# Patient Record
Sex: Male | Born: 1965 | Race: White | Hispanic: No | Marital: Married | Smoking: Former smoker
Health system: Southern US, Community
[De-identification: ages and names within clinical notes are randomized; demographics above are authoritative.]

---

## 2004-09-23 ENCOUNTER — Ambulatory Visit (HOSPITAL_COMMUNITY): Admission: RE | Admit: 2004-09-23 | Discharge: 2004-09-23 | Payer: Self-pay | Admitting: General Surgery

## 2004-11-23 ENCOUNTER — Ambulatory Visit (HOSPITAL_COMMUNITY): Admission: RE | Admit: 2004-11-23 | Discharge: 2004-11-23 | Payer: Self-pay | Admitting: Internal Medicine

## 2006-04-04 IMAGING — CT CT ABDOMEN W/ CM
1 of 4 series · 14 of 32 positions shown, 19 images · non-contrast
Comparison: none

CLINICAL DATA: Enlarged mesenteric lymph nodes.

[Series 2: abd_pel 5.0 b40f st · axial · 0.71mm/px · z∈[-508,-118]mm · 14 of 90 slices shown, 19 images]
[im 6/90  soft-tissue]
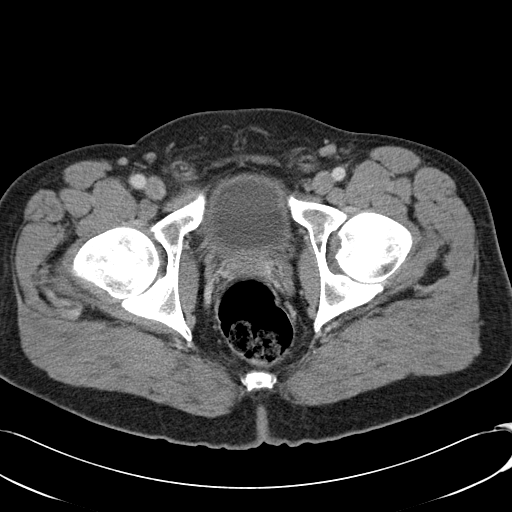
[im 6/90  bone]
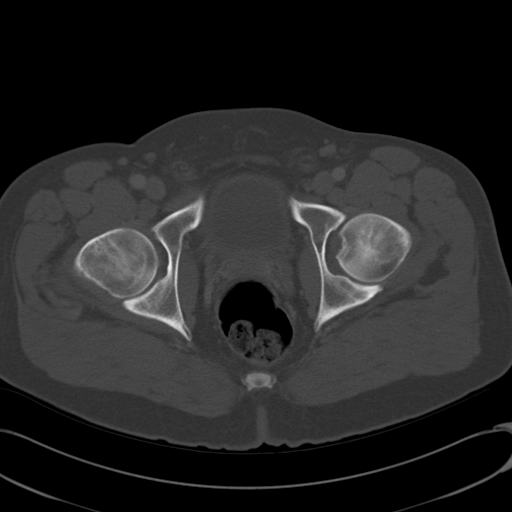
[im 11/90  soft-tissue]
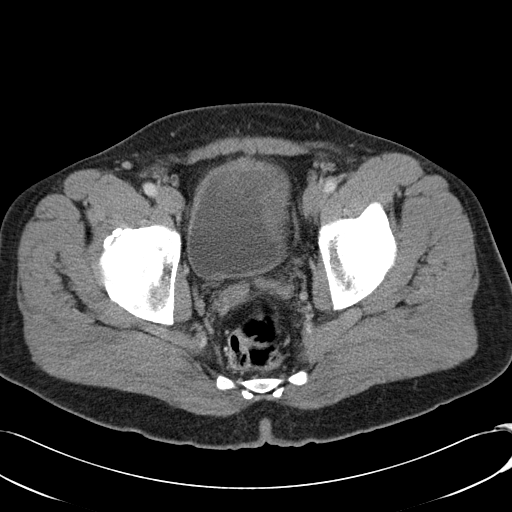
[im 21/90  soft-tissue]
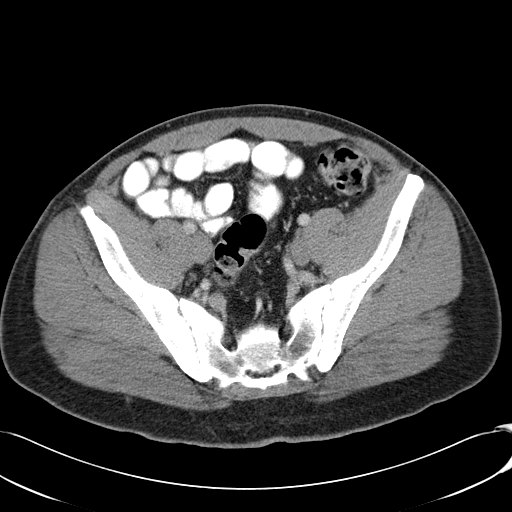
[im 27/90  soft-tissue]
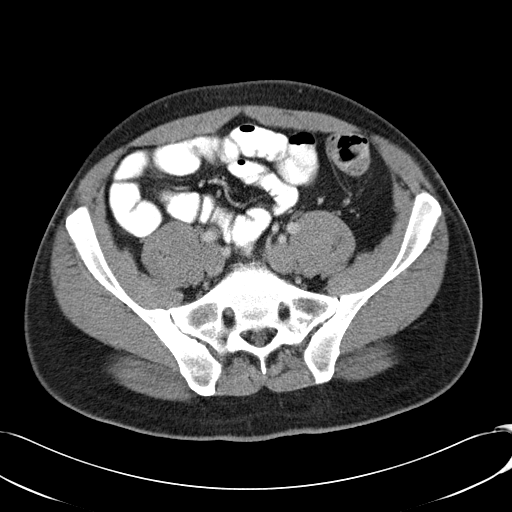
[im 32/90  soft-tissue]
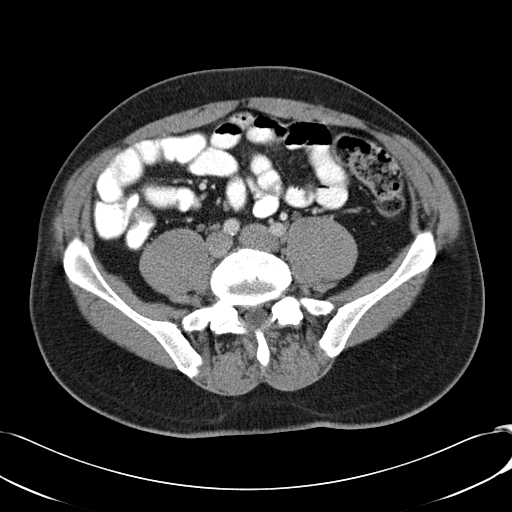
[im 37/90  soft-tissue]
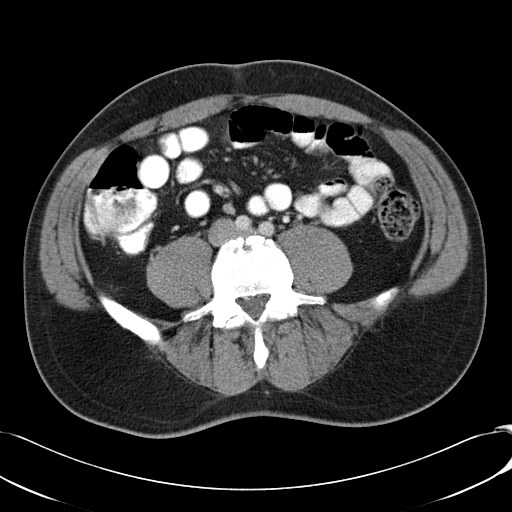
[im 48/90  soft-tissue]
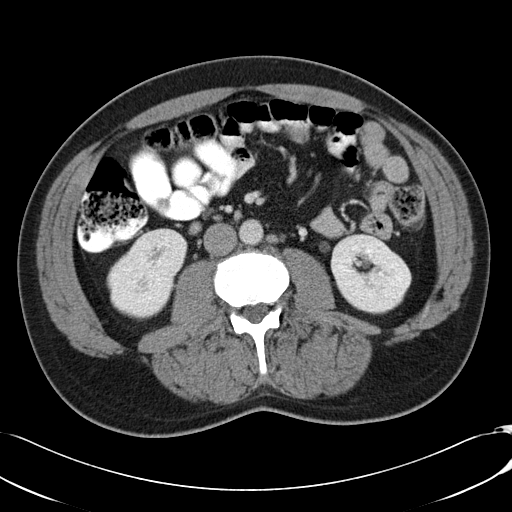
[im 53/90  soft-tissue]
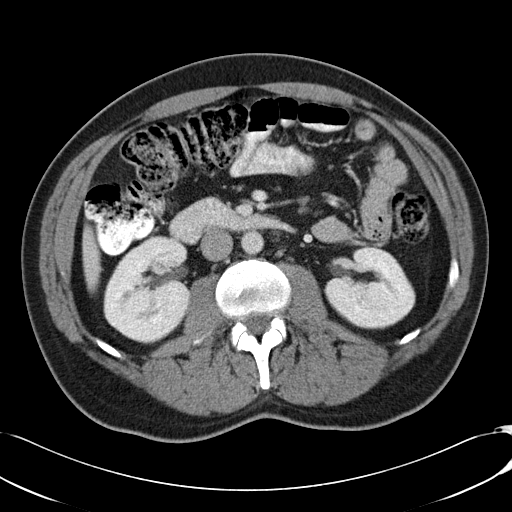
[im 58/90  soft-tissue]
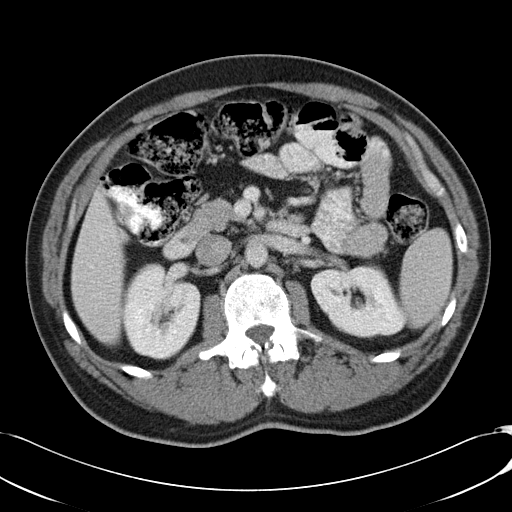
[im 58/90  bone]
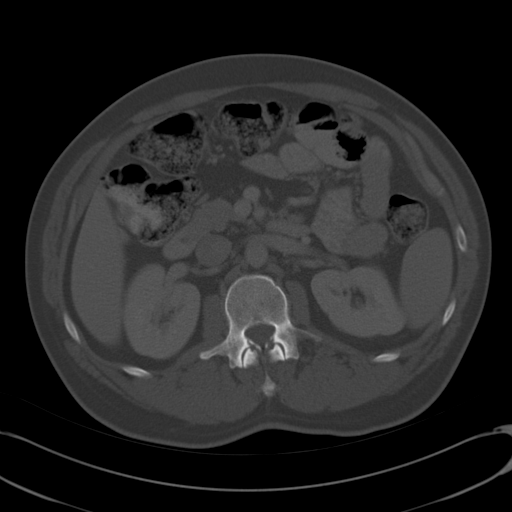
[im 63/90  soft-tissue]
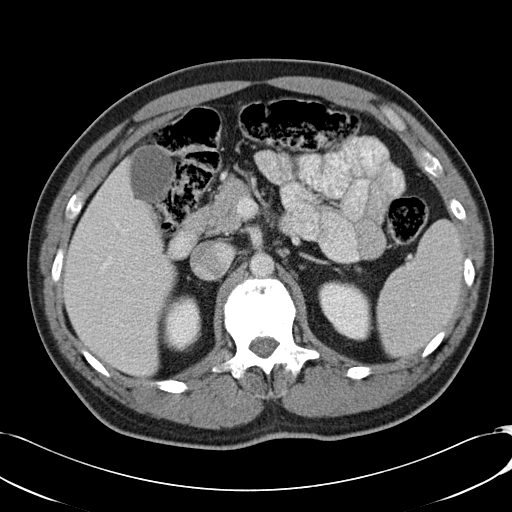
[im 69/90  soft-tissue]
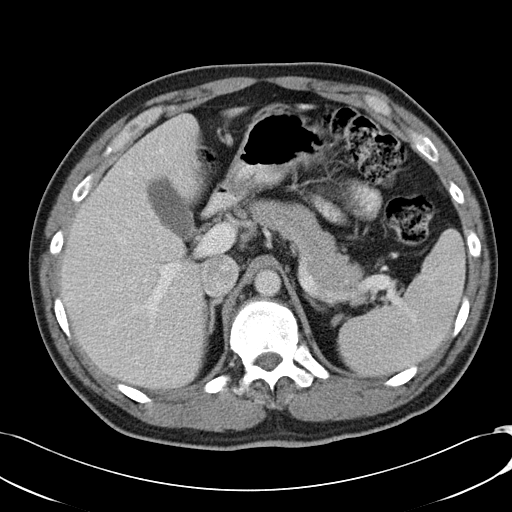
[im 69/90  lung]
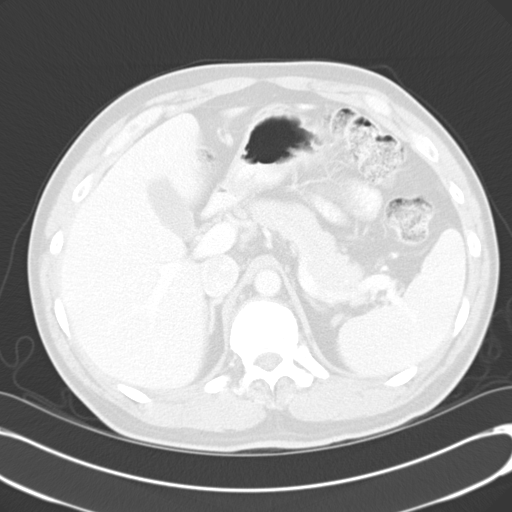
[im 74/90  lung]
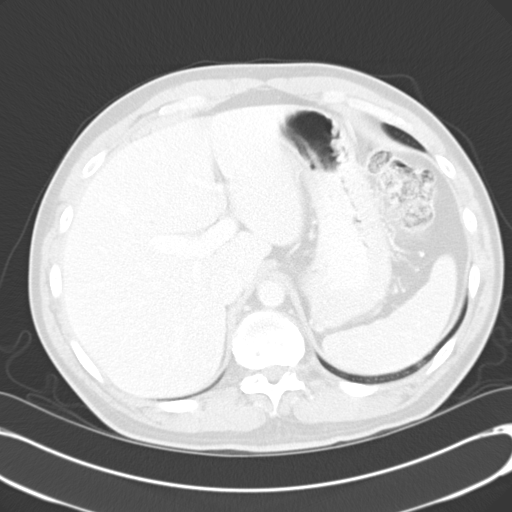
[im 79/90  soft-tissue]
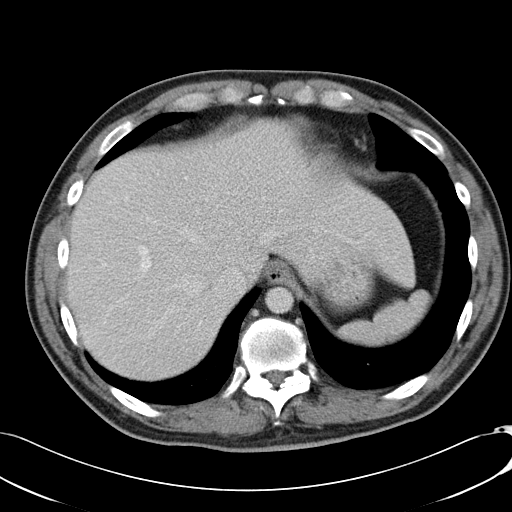
[im 79/90  lung]
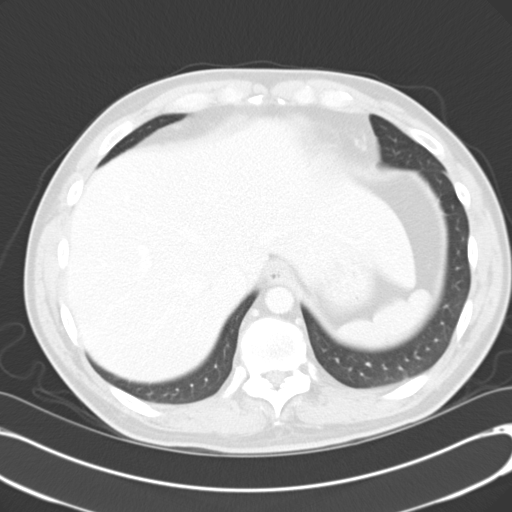
[im 84/90  soft-tissue]
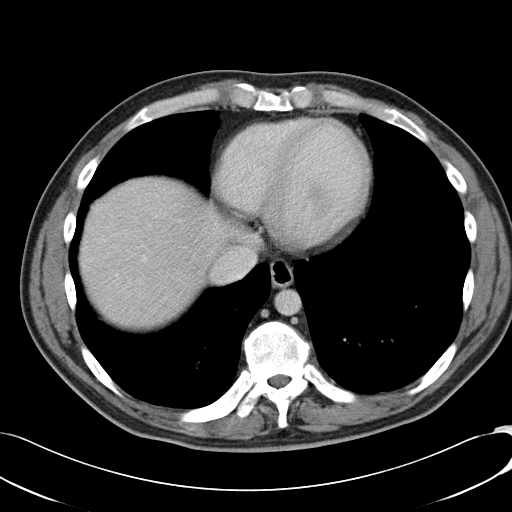
[im 84/90  lung]
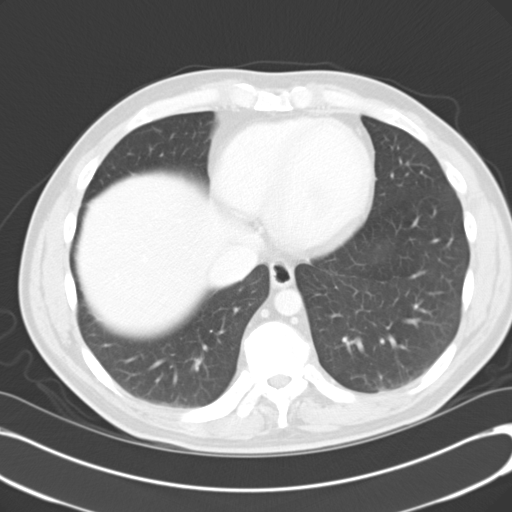

[14 of 32 positions shown; findings below may reference images not displayed]

CT abdomen with contrast:

Multidetector helical CT after 125 ml Qmnipaque-QEE IV.
Comparison 09/23/2004. Visualized portions of lung bases clear. Unremarkable
liver, gallbladder, spleen, pancreas, kidneys, adrenal glands, abdominal aorta.
Prominent lymph nodes at the root of the mesentery, measuring up to 11 mm in
short axis diameter, overall stable in size and number since the previous scan.
Gastrohepatic lymph node has slightly decreased in size, 9 mm in short axis
diameter (image #19). Prominent subcentimeter left paraaortic and aortocaval
lymph nodes are also noted. Small bowel remains decompressed, unremarkable. No
ascites. No free air.
IMPRESSION: 1. Stable appearance of retroperitoneal and central mesenteric lymph nodes

CT pelvis with contrast:

Normal appendix. Colon nondistended, unremarkable. No free fluid. Urinary
bladder incompletely distended. No adenopathy localized. Bilateral degenerative
changes in sacroiliac joints.
IMPRESSION: 1. Normal appendix. Unremarkable CT pelvis.

## 2007-06-26 ENCOUNTER — Ambulatory Visit (HOSPITAL_COMMUNITY): Admission: RE | Admit: 2007-06-26 | Discharge: 2007-06-26 | Payer: Self-pay | Admitting: Internal Medicine

## 2010-10-30 NOTE — Consult Note (Signed)
NAMEJAYMIAN, BOGART NO.:  [id number]   MEDICAL RECORD NO.:  [id number]          PATIENT TYPE:  OUT   LOCATION:  XRAY                         FACILITY:  [facility]   PHYSICIAN:  Ollen Gross. Vernell Morgans, M.D. DATE OF BIRTH:  09-Oct-1965   DATE OF CONSULTATION:  09/23/2004  DATE OF DISCHARGE:                                   CONSULTATION   Caleb Price is a 45 year old white male who presented to Urgent Care earlier  today with burning sensation in the mid abdomen.  He had a little bit of  nausea but no vomiting.  He has had some problems with constipation in the  recent past. He was sent to [facility] for CT scan and is being  seen in the lobby of radiology.  He states that his pain is now resolved and  he is not having any fevers.   PAST MEDICAL HISTORY:  1.  Ankylosing spondylitis.  2.  Iritis.   PAST SURGICAL HISTORY:  None.   MEDICATIONS:  Celebrex and Prilosec.   ALLERGIES:  No known drug allergies.   SOCIAL HISTORY:  He denies the use of alcohol or tobacco products.   FAMILY HISTORY:  Noncontributory.   PHYSICAL EXAMINATION:  VITAL SIGNS:  He is afebrile with stable vital signs.  GENERAL:  He is a well-developed, well-nourished black male in no acute  distress.  SKIN: Warm and dry.  No jaundice.  HEENT:  Extraocular muscles were intact.  Pupils are equal, round and  reactive to light.  Sclerae nonicteric.  LUNGS:  Clear bilaterally with no use of accessory respiratory muscles.  HEART:  Regular rate and rhythm with an impulse in the left chest.  ABDOMEN:  Soft, nontender.  No guarding or peritoneal signs.  No palpable  mass or hepatosplenomegaly.  EXTREMITIES:  No clubbing, cyanosis or edema. Good strength in his arms and  legs.  PSYCHIATRIC:  Alert and oriented x3 with no evidence of anxiety or  depression.   LABORATORY DATA:  On reviewing the CT scan with the radiologist, he had no  evidence of obstruction, no evidence of colitis, no evidence  of  appendicitis.  He did have some enlarged lymph nodes in his mesentery but  nothing else acute-appearing.   ASSESSMENT/PLAN:  This is a 45 year old white male with some abdominal pain  earlier today that is now resolved.  He looks well and has no acute findings  on his physical examination or on his CT scan.  I have recommended that he  follow up with his medical doctor to further work up these enlarged lymph  nodes and  intermittent abdominal burning.  He feels that he may need a referral to a  gastroenterologist to help figure this out.  He has our office number and  agrees to call us immediately if his pain comes back or if he feels worse in  any way.  Otherwise we will plan to see him back on a p.r.n. basis.      PST/MEDQ  D:  09/23/2004  T:  09/24/2004  Job:  [id number]

## 2016-11-25 DIAGNOSIS — M533 Sacrococcygeal disorders, not elsewhere classified: Secondary | ICD-10-CM | POA: Insufficient documentation

## 2016-11-25 DIAGNOSIS — G8929 Other chronic pain: Secondary | ICD-10-CM | POA: Insufficient documentation

## 2016-11-25 DIAGNOSIS — M545 Low back pain: Secondary | ICD-10-CM

## 2016-11-25 NOTE — Progress Notes (Signed)
Office Visit Note  Patient: Caleb Price             Date of Birth: [birth year]           MRN: [id number]             PCP: Katherina Mires, MD Referring: No ref. provider found Visit Date: 11/29/2016 Occupation: operator (pumps concrete)    Subjective:  Neck and lower back pain.   History of Present Illness: Caleb Price is a 51 y.o. male seen in consultation per request of Dr. Suzanna Obey. According to patient his symptoms started at age 39 with hip pain. He states he learned to live with the pain. He was seeing a chiropractor off and on. At age 70 he developed iritis and was seen by an ophthalmologist which was treated with topical eyedrops. He had recurrence of symptoms 30 days later at the time he was seen by his PCP and had labs and x-rays. 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 Abner Greenspan is seen Dr. Abner Greenspan for multiple years. He was initially treated with Celebrex then different anti-inflammatories and finally had been on Mobic. He's been having arm discomfort in his neck and his bilateral feet. His been experiencing numbness in his bilateral hands. He was recently seen by Dr. Amil Amen who did some lab work and advised Humira. Patient was not satisfied with this visit.Marland Kitchen He describes pain in the bottom of his bilateral feet. He has history of right foot Morton's neuroma which was treated with cortisone injection. He denies any discomfort in the thoracic and lumbar region he is not having any SI joint pain currently. Although he complains of some SI joint pain off and on. He states his hip joint discomfort flares off and on which is not causing any discomfort currently. None of the other joints are painful or swollen. He had no recurrence of iritis since the initial episode.  Activities of Daily Living:  Patient reports morning stiffness for 1 hour.   Patient Denies nocturnal pain.  Difficulty dressing/grooming: Denies Difficulty climbing stairs:  Denies Difficulty getting out of chair: Denies Difficulty using hands for taps, buttons, cutlery, and/or writing: Denies   Review of Systems  Constitutional: Positive for fatigue. Negative for night sweats and weakness ( ).  HENT: Negative for mouth sores, mouth dryness and nose dryness.   Eyes: Negative for redness and dryness.  Respiratory: Negative for shortness of breath and difficulty breathing.   Cardiovascular: Negative for chest pain, palpitations, hypertension, irregular heartbeat and swelling in legs/feet.  Gastrointestinal: Positive for constipation. Negative for diarrhea.  Endocrine: Negative for increased urination.  Musculoskeletal: Positive for arthralgias, joint pain and morning stiffness. Negative for joint swelling, myalgias, muscle weakness, muscle tenderness and myalgias.  Skin: Negative for color change, rash, hair loss, nodules/bumps, skin tightness, ulcers and sensitivity to sunlight.  Allergic/Immunologic: Negative for susceptible to infections.  Neurological: Negative for dizziness, fainting, memory loss and night sweats.  Hematological: Negative for swollen glands.  Psychiatric/Behavioral: Negative for depressed mood and sleep disturbance. The patient is not nervous/anxious.     PMFS History:  Patient Active Problem List   Diagnosis Date Noted  . Primary osteoarthritis of both feet 11/29/2016  . DJD (degenerative joint disease), cervical 11/29/2016  . Osteoarthritis of lumbar spine 11/29/2016  . Sacral pain 11/25/2016  . Chronic midline low back pain without sciatica 11/25/2016    History reviewed. No pertinent past medical history.  No family history on file.  History reviewed. No pertinent surgical history. Social History   Social History Narrative  . No narrative on file     Objective: Vital Signs: BP 138/74 (BP Location: Right Arm)   Pulse 88   Resp 16   Ht '6\' 1"'  (1.854 m)   Wt 235 lb (106.6 kg)   BMI 31.00 kg/m    Physical Exam   Constitutional: He is oriented to person, place, and time. He appears well-developed and well-nourished.  HENT:  Head: Normocephalic and atraumatic.  Eyes: Conjunctivae and EOM are normal. Pupils are equal, round, and reactive to light.  Neck: Normal range of motion. Neck supple.  Cardiovascular: Normal rate, regular rhythm and normal heart sounds.   Pulmonary/Chest: Effort normal and breath sounds normal.  Abdominal: Soft. Bowel sounds are normal.  Neurological: He is alert and oriented to person, place, and time.  Skin: Skin is warm and dry. Capillary refill takes less than 2 seconds.  Psychiatric: He has a normal mood and affect. His behavior is normal.  Nursing note and vitals reviewed.    Musculoskeletal Exam: C-spine some discomfort with lateral rotation. Thoracic spine he has mild kyphosis and some limitation. Lumbar spine good range of motion he was able to reach his toes. Shoulder joints elbow joints wrist joint MCPs PIPs DIPs with good range of motion. He has some thickening of PIP/DIP in his hands and feet consistent with osteoarthritis. Hip joints are good range of motion. He had no tenderness on palpation over SI joints.  CDAI Exam: No CDAI exam completed.    Investigation: Findings:  06/18/16 CMP Normal, CBC Normal, UA normal  06/08/2016 xray report show no acute lumbar spine findings. Mild DDDD at L5/S1 and L3/4. Small to moderated osteophyte formation anteriorly throughout the remainder of the lumbar spine without loss of disc space height. Possible etiology is DISH.  06/18/16 CBC CMP ESR Normal, CRP 5.9  08/13/16 negative TB gold and Hepatitis Panel     Imaging: Xr Hip Unilat W Or W/o Pelvis 2-3 Views Right  Result Date: 11/29/2016 No significant hip joint narrowing was noted. Mild SI joint sclerosis was noted. Which could be due to underlying osteoarthritis. No SI joint narrowing was noted. These findings could be consistent with osteoarthritis or chronic inflammatory  arthritis.  Xr Thoracic Spine 2 View  Result Date: 11/29/2016 Mild is spondylosis with small lateral osteophytes noted at multiple levels. Impression: Mild is spondylosis of thoracic spine  Xr Cervical Spine 2 Or 3 Views  Result Date: 11/29/2016 Multilevel spondylosis. Anterior spurring. Most prominent narrowing between C5-C6 and C6-7 Impression: Multilevel spondylosis with most prominent narrowing between C5 and C6  Xr Foot 2 Views Left  Result Date: 11/29/2016 Left first MTP all PIP/DIP narrowing was noted. No erosive changes were noted. No intertarsal joint space narrowing noted. Small calcaneal spur was noted. Impression: These findings are consistent with osteoarthritis of the left foot  Xr Foot 2 Views Right  Result Date: 11/29/2016 First MTP all PIP/DIP narrowing was noted. No other MTP narrowing or erosive changes were noted. No intertarsal joint space narrowing was noted. A small calcaneal spur was noted. These findings were consistent with osteoarthritis of the foot.  Xr Lumbar Spine 2-3 Views  Result Date: 11/29/2016 Dextroscoliosis of lumbar spine was noted with lateral osteophytes no syndesmophytes were noted.. Facet joint arthropathy was noted. Impression: Multilevel spondylosis with osteophytes   Speciality Comments: No specialty comments available.    Procedures:  No procedures performed Allergies: Patient has no known allergies.  Assessment / Plan:     Visit Diagnoses: Sacral pain - HLA B27+ , he has had history of sacral pain which has not been an issue recently. His x-rays revealed mild sclerosis in the SI joint. I will schedule MRI of the SI joint 2 rule out sacroiliitis.- Plan: XR HIP UNILAT W OR W/O PELVIS 2-3 VIEWS RIGHT  Pain, neck - Plan: XR Cervical Spine 2 or 3 views. X-rays were consistent with multilevel spondylosis with significant narrowing between C6-7. He's been having radiculopathy to bilateral upper extremities. I'll schedule MRI of his C-spine to  evaluate this further.  Pain in thoracic spine - Plan: XR Thoracic Spine 2 View: X-ray showed mild spondylosis no syndesmophytes were noted.  Chronic midline low back pain without sciatica - Plan: XR Lumbar Spine 2-3 Views. X-rays showed multilevel spondylosis and facet joint arthropathy. No syndesmophytes were noted.  Iritis/ one episode 2001 with no recurrence   Pain in both feet - Plan: XR Foot 2 Views Right, XR Foot 2 Views Left: X-rays today were consistent with-Primary osteoarthritis of both feet  Long-term NSAID use. Side effects of long-term use of NSAIDs was discussed. I'll obtain following labs today. Need for regular walks exercise was discussed. And natural aunt inflammatory were discussed.  Other fatigue - Plan: CBC with Differential/Platelet, COMPLETE METABOLIC PANEL WITH GFR, Sedimentation rate    Orders: Orders Placed This Encounter  Procedures  . XR Cervical Spine 2 or 3 views  . XR Thoracic Spine 2 View  . XR Lumbar Spine 2-3 Views  . XR HIP UNILAT W OR W/O PELVIS 2-3 VIEWS RIGHT  . XR Foot 2 Views Right  . XR Foot 2 Views Left  . MR CERVICAL SPINE WO CONTRAST  . MR SACRUM SI JOINTS WO CONTRAST  . CBC with Differential/Platelet  . COMPLETE METABOLIC PANEL WITH GFR  . Sedimentation rate   No orders of the defined types were placed in this encounter.   Face-to-face time spent with patient was 60 minutes. 50% of time was spent in counseling and coordination of care.  Follow-Up Instructions: Return for DDD.   Bo Merino, MD  Note - This record has been created using Editor, commissioning.  Chart creation errors have been sought, but may not always  have been located. Such creation errors do not reflect on  the standard of medical care.

## 2016-11-29 ENCOUNTER — Ambulatory Visit (INDEPENDENT_AMBULATORY_CARE_PROVIDER_SITE_OTHER): Payer: 59

## 2016-11-29 ENCOUNTER — Encounter: Payer: Self-pay | Admitting: Rheumatology

## 2016-11-29 ENCOUNTER — Ambulatory Visit (INDEPENDENT_AMBULATORY_CARE_PROVIDER_SITE_OTHER): Payer: 59 | Admitting: Rheumatology

## 2016-11-29 ENCOUNTER — Encounter (INDEPENDENT_AMBULATORY_CARE_PROVIDER_SITE_OTHER): Payer: Self-pay

## 2016-11-29 VITALS — BP 138/74 | HR 88 | Resp 16 | Ht 73.0 in | Wt 235.0 lb

## 2016-11-29 DIAGNOSIS — M545 Low back pain, unspecified: Secondary | ICD-10-CM

## 2016-11-29 DIAGNOSIS — M79671 Pain in right foot: Secondary | ICD-10-CM

## 2016-11-29 DIAGNOSIS — M542 Cervicalgia: Secondary | ICD-10-CM

## 2016-11-29 DIAGNOSIS — R5383 Other fatigue: Secondary | ICD-10-CM

## 2016-11-29 DIAGNOSIS — M546 Pain in thoracic spine: Secondary | ICD-10-CM

## 2016-11-29 DIAGNOSIS — M47816 Spondylosis without myelopathy or radiculopathy, lumbar region: Secondary | ICD-10-CM | POA: Diagnosis not present

## 2016-11-29 DIAGNOSIS — H209 Unspecified iridocyclitis: Secondary | ICD-10-CM | POA: Diagnosis not present

## 2016-11-29 DIAGNOSIS — M5030 Other cervical disc degeneration, unspecified cervical region: Secondary | ICD-10-CM | POA: Diagnosis not present

## 2016-11-29 DIAGNOSIS — M533 Sacrococcygeal disorders, not elsewhere classified: Secondary | ICD-10-CM

## 2016-11-29 DIAGNOSIS — M79672 Pain in left foot: Secondary | ICD-10-CM

## 2016-11-29 DIAGNOSIS — M47812 Spondylosis without myelopathy or radiculopathy, cervical region: Secondary | ICD-10-CM | POA: Insufficient documentation

## 2016-11-29 DIAGNOSIS — M19071 Primary osteoarthritis, right ankle and foot: Secondary | ICD-10-CM | POA: Diagnosis not present

## 2016-11-29 DIAGNOSIS — G8929 Other chronic pain: Secondary | ICD-10-CM | POA: Diagnosis not present

## 2016-11-29 DIAGNOSIS — M19072 Primary osteoarthritis, left ankle and foot: Secondary | ICD-10-CM

## 2016-11-29 LAB — CBC WITH DIFFERENTIAL/PLATELET
BASOS PCT: 0 %
Basophils Absolute: 0 cells/uL (ref 0–200)
EOS PCT: 3 %
Eosinophils Absolute: 291 cells/uL (ref 15–500)
HCT: 45.3 % (ref 38.5–50.0)
HEMOGLOBIN: 15.1 g/dL (ref 13.2–17.1)
LYMPHS ABS: 2134 {cells}/uL (ref 850–3900)
Lymphocytes Relative: 22 %
MCH: 28.6 pg (ref 27.0–33.0)
MCHC: 33.3 g/dL (ref 32.0–36.0)
MCV: 85.8 fL (ref 80.0–100.0)
MPV: 9.8 fL (ref 7.5–12.5)
Monocytes Absolute: 679 cells/uL (ref 200–950)
Monocytes Relative: 7 %
NEUTROS PCT: 68 %
Neutro Abs: 6596 cells/uL (ref 1500–7800)
Platelets: 250 10*3/uL (ref 140–400)
RBC: 5.28 MIL/uL (ref 4.20–5.80)
RDW: 13.6 % (ref 11.0–15.0)
WBC: 9.7 10*3/uL (ref 3.8–10.8)

## 2016-11-29 NOTE — Patient Instructions (Signed)
Natural anti-inflammatories  You can purchase these at [employer], [employer] or online.  . Turmeric (capsules)  . Ginger (ginger root or capsules)  . Omega 3 (Fish, flax seeds, chia seeds, walnuts, almonds)  . Tart cherry (dried or extract)   Patient should be under the care of a physician while taking these supplements. This may not be reproduced without the permission of Dr. Eldar Robitaille.  

## 2016-11-30 LAB — COMPLETE METABOLIC PANEL WITH GFR
ALT: 30 U/L (ref 9–46)
AST: 18 U/L (ref 10–35)
Albumin: 4.1 g/dL (ref 3.6–5.1)
Alkaline Phosphatase: 52 U/L (ref 40–115)
BUN: 14 mg/dL (ref 7–25)
CHLORIDE: 102 mmol/L (ref 98–110)
CO2: 24 mmol/L (ref 20–31)
Calcium: 9.4 mg/dL (ref 8.6–10.3)
Creat: 0.82 mg/dL (ref 0.70–1.33)
GFR, Est African American: >89 mL/min (ref >=60)
GFR, Est Non African American: >89 mL/min (ref >=60)
GLUCOSE: 100 mg/dL — AB (ref 65–99)
POTASSIUM: 4.7 mmol/L (ref 3.5–5.3)
SODIUM: 139 mmol/L (ref 135–146)
Total Bilirubin: 0.3 mg/dL (ref 0.2–1.2)
Total Protein: 6.5 g/dL (ref 6.1–8.1)

## 2016-11-30 LAB — SEDIMENTATION RATE: Sed Rate: 1 mm/hr (ref 0–15)

## 2016-11-30 NOTE — Progress Notes (Signed)
Within normal limits

## 2016-12-10 ENCOUNTER — Encounter: Payer: Self-pay | Admitting: Rheumatology

## 2016-12-21 ENCOUNTER — Ambulatory Visit (HOSPITAL_COMMUNITY): Payer: 59

## 2016-12-26 DIAGNOSIS — Z791 Long term (current) use of non-steroidal anti-inflammatories (NSAID): Secondary | ICD-10-CM | POA: Insufficient documentation

## 2016-12-26 DIAGNOSIS — M5134 Other intervertebral disc degeneration, thoracic region: Secondary | ICD-10-CM | POA: Insufficient documentation

## 2016-12-26 DIAGNOSIS — Z8669 Personal history of other diseases of the nervous system and sense organs: Secondary | ICD-10-CM | POA: Insufficient documentation

## 2016-12-26 DIAGNOSIS — M5136 Other intervertebral disc degeneration, lumbar region: Secondary | ICD-10-CM | POA: Insufficient documentation

## 2016-12-26 NOTE — Progress Notes (Deleted)
Office Visit Note  Patient: Caleb Price             Date of Birth: 02-02-1966           MRN: [id number]             PCP: Katherina Mires, MD Referring: No ref. provider found Visit Date: 12/29/2016 Occupation: '@GUARO[employer]' @    Subjective:  No chief complaint on file.   History of Present Illness: Caleb Price is a 51 y.o. male with history of sacral pain in positive HLA-B27. He also had an episode of iritis in 2001. He's been having discomfort in his C-spine with radiculopathy in bilateral arms. MRI of C-spine and SI joints were ordered last visit.  Activities of Daily Living:  Patient reports morning stiffness for *** {minute/hour:19697}.   Patient {ACTIONS;DENIES/REPORTS:21021675::"Denies"} nocturnal pain.  Difficulty dressing/grooming: {ACTIONS;DENIES/REPORTS:21021675::"Denies"} Difficulty climbing stairs: {ACTIONS;DENIES/REPORTS:21021675::"Denies"} Difficulty getting out of chair: {ACTIONS;DENIES/REPORTS:21021675::"Denies"} Difficulty using hands for taps, buttons, cutlery, and/or writing: {ACTIONS;DENIES/REPORTS:21021675::"Denies"}   No Rheumatology ROS completed.   PMFS History:  Patient Active Problem List   Diagnosis Date Noted  . Primary osteoarthritis of both feet 11/29/2016  . DJD (degenerative joint disease), cervical 11/29/2016  . Osteoarthritis of lumbar spine 11/29/2016  . Sacral pain 11/25/2016  . Chronic midline low back pain without sciatica 11/25/2016    No past medical history on file.  No family history on file. No past surgical history on file. Social History   Social History Narrative  . No narrative on file     Objective: Vital Signs: There were no vitals taken for this visit.   Physical Exam   Musculoskeletal Exam: ***  CDAI Exam: No CDAI exam completed.    Investigation: No additional findings. 11/29/2016 CBC normal, CMP normal, ESR 1  Imaging: Xr Hip Unilat W Or W/o Pelvis 2-3 Views Right  Result Date: 11/29/2016 No  significant hip joint narrowing was noted. Mild SI joint sclerosis was noted. Which could be due to underlying osteoarthritis. No SI joint narrowing was noted. These findings could be consistent with osteoarthritis or chronic inflammatory arthritis.  Xr Thoracic Spine 2 View  Result Date: 11/29/2016 Mild is spondylosis with small lateral osteophytes noted at multiple levels. Impression: Mild is spondylosis of thoracic spine  Xr Cervical Spine 2 Or 3 Views  Result Date: 11/29/2016 Multilevel spondylosis. Anterior spurring. Most prominent narrowing between C5-C6 and C6-7 Impression: Multilevel spondylosis with most prominent narrowing between C5 and C6  Xr Foot 2 Views Left  Result Date: 11/29/2016 Left first MTP all PIP/DIP narrowing was noted. No erosive changes were noted. No intertarsal joint space narrowing noted. Small calcaneal spur was noted. Impression: These findings are consistent with osteoarthritis of the left foot  Xr Foot 2 Views Right  Result Date: 11/29/2016 First MTP all PIP/DIP narrowing was noted. No other MTP narrowing or erosive changes were noted. No intertarsal joint space narrowing was noted. A small calcaneal spur was noted. These findings were consistent with osteoarthritis of the foot.  Xr Lumbar Spine 2-3 Views  Result Date: 11/29/2016 Dextroscoliosis of lumbar spine was noted with lateral osteophytes no syndesmophytes were noted.. Facet joint arthropathy was noted. Impression: Multilevel spondylosis with osteophytes   Speciality Comments: No specialty comments available.    Procedures:  No procedures performed Allergies: Patient has no known allergies.   Assessment / Plan:     Visit Diagnoses: No diagnosis found.    Orders: No orders of the defined types were placed in this encounter.  No  orders of the defined types were placed in this encounter.   Face-to-face time spent with patient was *** minutes. 50% of time was spent in counseling and  coordination of care.  Follow-Up Instructions: No Follow-up on file.   Bo Merino, MD  Note - This record has been created using Editor, commissioning.  Chart creation errors have been sought, but may not always  have been located. Such creation errors do not reflect on  the standard of medical care.

## 2016-12-29 ENCOUNTER — Ambulatory Visit: Payer: Self-pay | Admitting: Rheumatology

## 2018-03-27 ENCOUNTER — Encounter: Payer: Self-pay | Admitting: Emergency Medicine

## 2024-06-26 ENCOUNTER — Other Ambulatory Visit: Payer: Self-pay | Admitting: Orthopaedic Surgery

## 2024-06-26 DIAGNOSIS — M25571 Pain in right ankle and joints of right foot: Secondary | ICD-10-CM

## 2024-07-04 ENCOUNTER — Inpatient Hospital Stay: Admission: RE | Admit: 2024-07-04 | Source: Ambulatory Visit

## 2024-07-06 ENCOUNTER — Ambulatory Visit: Admission: RE | Admit: 2024-07-06 | Payer: Self-pay | Source: Ambulatory Visit

## 2024-07-06 ENCOUNTER — Other Ambulatory Visit

## 2024-07-06 DIAGNOSIS — M25571 Pain in right ankle and joints of right foot: Secondary | ICD-10-CM
